# Patient Record
Sex: Male | Born: 1960 | Hispanic: No | Smoking: Former smoker
Health system: Southern US, Community
[De-identification: ages and names within clinical notes are randomized; demographics above are authoritative.]

## PROBLEM LIST (undated history)

## (undated) DIAGNOSIS — E119 Type 2 diabetes mellitus without complications: Secondary | ICD-10-CM

---

## 2010-07-09 ENCOUNTER — Ambulatory Visit (HOSPITAL_COMMUNITY)
Admission: RE | Admit: 2010-07-09 | Discharge: 2010-07-09 | Disposition: A | Discharge status: Home / Self Care | Payer: BC Managed Care – PPO | Source: Ambulatory Visit | Attending: Internal Medicine | Admitting: Internal Medicine

## 2010-07-09 ENCOUNTER — Encounter: Payer: Self-pay | Admitting: Internal Medicine

## 2010-07-09 DIAGNOSIS — Z7982 Long term (current) use of aspirin: Secondary | ICD-10-CM | POA: Insufficient documentation

## 2010-07-09 DIAGNOSIS — E785 Hyperlipidemia, unspecified: Secondary | ICD-10-CM | POA: Insufficient documentation

## 2010-07-09 DIAGNOSIS — Z1211 Encounter for screening for malignant neoplasm of colon: Secondary | ICD-10-CM | POA: Insufficient documentation

## 2010-07-09 DIAGNOSIS — K5730 Diverticulosis of large intestine without perforation or abscess without bleeding: Secondary | ICD-10-CM

## 2010-07-21 NOTE — Op Note (Addendum)
  NAME:  Nicholas Richards, Nicholas Richards               ACCOUNT NO.:  [id number]  MEDICAL RECORD NO.:  [id number]           PATIENT TYPE:  O  LOCATION:  DAYP                          FACILITY:  [facility]  PHYSICIAN:  R. Roetta Sessions, M.D. DATE OF BIRTH:  01/13/1961  DATE OF PROCEDURE:  07/09/2010 DATE OF DISCHARGE:                              OPERATIVE REPORT   INDICATIONS FOR PROCEDURE:  A 50 year old gentleman with no lower GI tract symptoms, sent over for his first ever colonoscopy at a courtesy of Dr. Oval Linsey.  There is no family history of polyps or colon cancer.  Colonoscopy is now being done as screening maneuver.  Risks, benefits, limitations, alternatives, imponderables have been reviewed, questions answered.  Please see the documentation in the medical record.  PROCEDURE NOTE:  O2 saturation, blood pressure, pulse, respirations were monitored the entirety of the procedure.  CONSCIOUS SEDATION:  Versed 4 mg IV, Demerol 75 mg IV in divided doses.  INSTRUMENT:  Pentax video chip system.  FINDINGS:  Digital rectal exam revealed no abnormalities.  Endoscopic findings:  Prep was good.  Colon:  Colonic mucosa was surveyed from the rectosigmoid junction through the left transverse right colon to the appendiceal orifice, ileocecal valve/cecum.  These structures were well seen and photographed for the record.  From this level, scope was slowly and cautiously withdrawn.  All previously mentioned mucosal surfaces were again seen.  The patient has scattered left-sided diverticula. Remainder of colonic mucosa appeared normal.  Scope was pulled down to the rectum where a thorough examination of rectal mucosa including retroflexed view of the anal verge was undertaken.  The rectal mucosa appeared normal.  The patient tolerated the procedure well and was reactive to endoscopy.  Cecal withdrawal time 7 minutes.  IMPRESSION: 1. Normal rectum. 2. Left-sided diverticula, remainder of colonic mucosa  appeared     normal.  RECOMMENDATIONS: 1. Diverticulosis literature provided to [name]. 2. Recommend repeat screening colonoscopy, 10 years.     Jonathon Bellows, M.D.     RMR/MEDQ  D:  07/09/2010  T:  07/09/2010  Job:  [id number]  cc:   Melvyn Novas, MD Fax: [phone]  Electronically Signed by Lorrin Goodell M.D. on 07/20/2010 02:41:33 PM Electronically Signed by Lorrin Goodell M.D. on 07/20/2010 03:17:17 PM Electronically Signed by Lorrin Goodell M.D. on 07/20/2010 03:42:01 PM Electronically Signed by Lorrin Goodell M.D. on 07/20/2010 04:18:43 PM Electronically Signed by Lorrin Goodell M.D. on 07/20/2010 04:49:53 PM Electronically Signed by Lorrin Goodell M.D. on 07/20/2010 04:49:53 PM Electronically Signed by Lorrin Goodell M.D. on 07/20/2010 07:36:57 PM

## 2012-04-30 ENCOUNTER — Telehealth: Payer: Self-pay | Admitting: Family Medicine

## 2012-04-30 NOTE — Telephone Encounter (Signed)
Not a pt here.

## 2013-08-17 ENCOUNTER — Emergency Department (HOSPITAL_COMMUNITY)
Admission: EM | Admit: 2013-08-17 | Discharge: 2013-08-17 | Disposition: A | Discharge status: Home / Self Care | Payer: BC Managed Care – PPO | Attending: Emergency Medicine | Admitting: Emergency Medicine

## 2013-08-17 ENCOUNTER — Encounter (HOSPITAL_COMMUNITY): Payer: Self-pay | Admitting: Emergency Medicine

## 2013-08-17 ENCOUNTER — Emergency Department (HOSPITAL_COMMUNITY): Payer: BC Managed Care – PPO

## 2013-08-17 DIAGNOSIS — J159 Unspecified bacterial pneumonia: Secondary | ICD-10-CM | POA: Insufficient documentation

## 2013-08-17 DIAGNOSIS — J029 Acute pharyngitis, unspecified: Secondary | ICD-10-CM | POA: Insufficient documentation

## 2013-08-17 DIAGNOSIS — J189 Pneumonia, unspecified organism: Secondary | ICD-10-CM

## 2013-08-17 DIAGNOSIS — Z792 Long term (current) use of antibiotics: Secondary | ICD-10-CM | POA: Insufficient documentation

## 2013-08-17 LAB — RAPID STREP SCREEN (MED CTR MEBANE ONLY): Streptococcus, Group A Screen (Direct): NEGATIVE

## 2013-08-17 MED ORDER — LEVOFLOXACIN 750 MG PO TABS: 750.0000 mg | ORAL_TABLET | Freq: Every day | ORAL | Status: DC

## 2013-08-17 MED ORDER — OXYCODONE-ACETAMINOPHEN 5-325 MG PO TABS: 1.0000 | ORAL_TABLET | Freq: Four times a day (QID) | ORAL | Status: DC | PRN

## 2013-08-17 NOTE — ED Notes (Signed)
Pt c/o cough, fever, and congestion x2 days. Denies n/v/d. Cough is productive with green sputum.

## 2013-08-17 NOTE — ED Notes (Signed)
Pt alert & oriented x4, stable gait. Patient given discharge instructions, paperwork & prescription(s). Patient  instructed to stop at the registration desk to finish any additional paperwork. Patient verbalized understanding. Pt left department w/ no further questions. 

## 2013-08-17 NOTE — Discharge Instructions (Signed)

## 2013-08-17 NOTE — ED Provider Notes (Signed)
CSN: [id number]     Arrival date & time 08/17/13  1016 History  This chart was scribed for No att. providers found,  by Ashley JacobsBrittany Andrews, ED Scribe. The patient was seen in room [facility] and the patient's care was started at 1:14 PM.   First MD Initiated Contact with Patient 08/17/13 1126     Chief Complaint  Patient presents with  . Cough  . Nasal Congestion     (Consider location/radiation/quality/duration/timing/severity/associated sxs/prior Treatment) HPI HPI Comments: Nicholas Richards is a 53 y.o. male who presents to the Emergency Department complaining of cough and nasal congestion onset two days ago. Pt has the associated symptoms of congestion,non productive cough, fever, and sore throat. He has tried Mucinex to no relief and antibiotics from a prior rx.  History reviewed. No pertinent past medical history. History reviewed. No pertinent past surgical history. History reviewed. No pertinent family history. History  Substance Use Topics  . Smoking status: Never Smoker   . Smokeless tobacco: Not on file  . Alcohol Use: No    Review of Systems  Constitutional: Positive for fever.  HENT: Positive for congestion and sore throat.   Respiratory: Positive for cough (non productive).   Gastrointestinal: Negative for nausea, vomiting and diarrhea.  Genitourinary: Negative for dysuria.  All other systems reviewed and are negative.      Allergies  Review of patient's allergies indicates no known allergies.  Home Medications   Current Outpatient Rx  Name  Route  Sig  Dispense  Refill  . amoxicillin (AMOXIL) 500 MG capsule   Oral   Take 500 mg by mouth 3 (three) times daily.         . GuaiFENesin (MUCINEX PO)   Oral   Take 10 mLs by mouth every 4 (four) hours as needed (for cough and congestion.).         Marland Kitchen. levofloxacin (LEVAQUIN) 750 MG tablet   Oral   Take 1 tablet (750 mg total) by mouth daily.   5 tablet   0   . oxyCODONE-acetaminophen (PERCOCET/ROXICET)  5-325 MG per tablet   Oral   Take 1-2 tablets by mouth every 6 (six) hours as needed for severe pain.   10 tablet   0    BP 114/79  Pulse 98  Temp(Src) 100.6 F (38.1 C) (Oral)  Resp 24  SpO2 97% Physical Exam  Constitutional: He is oriented to person, place, and time. He appears well-developed and well-nourished. He appears distressed.  HENT:  Head: Normocephalic.  Mouth/Throat: Posterior oropharyngeal erythema present. No oropharyngeal exudate or posterior oropharyngeal edema.  Nasal congestion Mild posterior erythema  Eyes: Pupils are equal, round, and reactive to light.  Neck: Normal range of motion. Erythema present.  Cardiovascular: Normal rate and normal heart sounds.  Exam reveals no gallop and no friction rub.   No murmur heard. Pulmonary/Chest: Effort normal. No respiratory distress. He has wheezes. He has rales.    Mild wheezes Scattered Rales     Musculoskeletal: Normal range of motion. He exhibits no edema.  No peripheral edema   Lymphadenopathy:    He has no cervical adenopathy.  Neurological: He is alert and oriented to person, place, and time. No cranial nerve deficit. Coordination normal.  Skin: Skin is warm and dry. No rash noted.  Psychiatric: He has a normal mood and affect. His behavior is normal.    ED Course  Procedures (including critical care time)     COORDINATION OF CARE:  1:14 PM Discussed course  of care with pt . Pt understands and agrees.    Labs Review Labs Reviewed  RAPID STREP SCREEN  CULTURE, GROUP A STREP   Imaging Review Dg Chest 2 View  08/17/2013   CLINICAL DATA:  cough  EXAM: CHEST  2 VIEW  COMPARISON:  None.  FINDINGS: Cardiac silhouette within normal limits. Focal area of increased density is appreciated within the right lower lobe. Osseous structures unremarkable.  IMPRESSION: Findings likely reflecting an infiltrate right lower lobe atelectasis cannot be excluded.   Electronically Signed   By: Salome Holmes M.D.    On: 08/17/2013 12:06     EKG Interpretation None      MDM   Final diagnoses:  CAP (community acquired pneumonia)    Patient presents with URI symptoms and cough. Negative strep, but x-ray shows pneumonia. Does have some scattered rales at that site. We'll treat as pneumonia. We'll give Levaquin since patient has been taking amoxicillin on his own.     Juliet Rude. Rubin Payor, MD 08/17/13 1314

## 2013-08-19 LAB — CULTURE, GROUP A STREP

## 2013-10-29 ENCOUNTER — Ambulatory Visit (HOSPITAL_COMMUNITY)
Admission: RE | Admit: 2013-10-29 | Discharge: 2013-10-29 | Disposition: A | Discharge status: Home / Self Care | Payer: BC Managed Care – PPO | Source: Ambulatory Visit | Attending: Family Medicine | Admitting: Family Medicine

## 2013-10-29 ENCOUNTER — Other Ambulatory Visit (HOSPITAL_COMMUNITY): Payer: Self-pay | Admitting: Family Medicine

## 2013-10-29 DIAGNOSIS — R05 Cough: Secondary | ICD-10-CM | POA: Insufficient documentation

## 2013-10-29 DIAGNOSIS — R059 Cough, unspecified: Secondary | ICD-10-CM

## 2017-12-31 ENCOUNTER — Encounter (HOSPITAL_COMMUNITY): Payer: Self-pay | Admitting: Emergency Medicine

## 2017-12-31 ENCOUNTER — Other Ambulatory Visit: Payer: Self-pay

## 2017-12-31 ENCOUNTER — Emergency Department (HOSPITAL_COMMUNITY)
Admission: EM | Admit: 2017-12-31 | Discharge: 2017-12-31 | Disposition: A | Discharge status: Home / Self Care | Payer: BLUE CROSS/BLUE SHIELD | Attending: Emergency Medicine | Admitting: Emergency Medicine

## 2017-12-31 ENCOUNTER — Emergency Department (HOSPITAL_COMMUNITY): Payer: BLUE CROSS/BLUE SHIELD

## 2017-12-31 DIAGNOSIS — J209 Acute bronchitis, unspecified: Secondary | ICD-10-CM | POA: Insufficient documentation

## 2017-12-31 DIAGNOSIS — R05 Cough: Secondary | ICD-10-CM | POA: Diagnosis present

## 2017-12-31 DIAGNOSIS — Z79899 Other long term (current) drug therapy: Secondary | ICD-10-CM | POA: Insufficient documentation

## 2017-12-31 DIAGNOSIS — Z87891 Personal history of nicotine dependence: Secondary | ICD-10-CM | POA: Insufficient documentation

## 2017-12-31 MED ORDER — LORATADINE-PSEUDOEPHEDRINE ER 5-120 MG PO TB12: 1.0000 | ORAL_TABLET | Freq: Two times a day (BID) | ORAL | 0 refills | Status: DC

## 2017-12-31 MED ORDER — PREDNISONE 20 MG PO TABS
40.0000 mg | ORAL_TABLET | Freq: Once | ORAL | Status: AC
  Administered 2017-12-31: 40 mg via ORAL
  Filled 2017-12-31: qty 2

## 2017-12-31 MED ORDER — AZITHROMYCIN 250 MG PO TABS: ORAL_TABLET | ORAL | 0 refills | Status: DC

## 2017-12-31 MED ORDER — ONDANSETRON HCL 4 MG PO TABS
4.0000 mg | ORAL_TABLET | Freq: Once | ORAL | Status: AC
  Administered 2017-12-31: 4 mg via ORAL
  Filled 2017-12-31: qty 1

## 2017-12-31 MED ORDER — DEXAMETHASONE 4 MG PO TABS: 4.0000 mg | ORAL_TABLET | Freq: Two times a day (BID) | ORAL | 0 refills | Status: DC

## 2017-12-31 MED ORDER — AZITHROMYCIN 250 MG PO TABS
500.0000 mg | ORAL_TABLET | Freq: Once | ORAL | Status: AC
  Administered 2017-12-31: 500 mg via ORAL
  Filled 2017-12-31: qty 2

## 2017-12-31 NOTE — Discharge Instructions (Addendum)
Your vital signs within normal limits.  Your oxygen level is 97% on room air.  Your chest x-ray is negative for pneumonia, collapsed lung, fluid, or other emergencies.  I suspect that you have a bronchitis and may be early upper respiratory infection.  Please use Zithromax 1 tablet daily with food, Claritin-D and Decadron 2 times daily with a meal.  Please increase fluids.  Please see Ms. Placey for additional evaluation and management if not improving.

## 2017-12-31 NOTE — ED Triage Notes (Signed)
Pt reports having "a tickle" in his throat x 3 weeks with coughing up clear thick mucous.  States cough is worse at night.  Denies other s/s.

## 2017-12-31 NOTE — ED Provider Notes (Signed)
[facility] Provider Note   CSN: [id number] Arrival date & time: 12/31/17  [id number]     History   Chief Complaint Chief Complaint  Patient presents with  . Cough    HPI Nicholas Richards is a 57 y.o. male.  The history is provided by the patient.  Cough  The current episode started more than 1 week ago. The problem has been gradually improving. The cough is non-productive. There has been no fever. Pertinent negatives include no chest pain, no chills, no ear pain, no sore throat, no shortness of breath and no wheezing. He has tried nothing for the symptoms. He is not a smoker. His past medical history is significant for bronchitis and pneumonia.    History reviewed. No pertinent past medical history.  There are no active problems to display for this patient.   History reviewed. No pertinent surgical history.      Home Medications    Prior to Admission medications   Medication Sig Start Date End Date Taking? Authorizing Provider  amoxicillin (AMOXIL) 500 MG capsule Take 500 mg by mouth 3 (three) times daily.    [provider]  azithromycin (ZITHROMAX) 250 MG tablet  12/27/17   [provider]  GuaiFENesin (MUCINEX PO) Take 10 mLs by mouth every 4 (four) hours as needed (for cough and congestion.).    [provider]  levofloxacin (LEVAQUIN) 750 MG tablet Take 1 tablet (750 mg total) by mouth daily. 08/17/13   Benjiman CorePickering, Nathan, MD  oxyCODONE-acetaminophen (PERCOCET/ROXICET) 5-325 MG per tablet Take 1-2 tablets by mouth every 6 (six) hours as needed for severe pain. 08/17/13   Benjiman CorePickering, Nathan, MD  rosuvastatin (CRESTOR) 20 MG tablet Take 1 tablet by mouth daily. 12/19/17   [provider]    Family History History reviewed. No pertinent family history.  Social History Social History   Tobacco Use  . Smoking status: Former Smoker  Substance Use Topics  . Alcohol use: No  . Drug use: No     Allergies   Patient has no  known allergies.   Review of Systems Review of Systems  Constitutional: Negative for activity change and chills.       All ROS Neg except as noted in HPI  HENT: Negative for ear pain, nosebleeds and sore throat.   Eyes: Negative for photophobia and discharge.  Respiratory: Positive for cough. Negative for shortness of breath and wheezing.   Cardiovascular: Negative for chest pain and palpitations.  Gastrointestinal: Negative for abdominal pain and blood in stool.  Genitourinary: Negative for dysuria, frequency and hematuria.  Musculoskeletal: Negative for arthralgias, back pain and neck pain.  Skin: Negative.   Neurological: Negative for dizziness, seizures and speech difficulty.  Psychiatric/Behavioral: Negative for confusion and hallucinations.     Physical Exam Updated Vital Signs BP 126/82 (BP Location: Right Arm)   Pulse 80   Temp (!) 97.3 F (36.3 C) (Oral)   Resp 16   Ht 5\' 11"  (1.803 m)   Wt 120.2 kg   SpO2 97%   BMI 36.96 kg/m   Physical Exam  Constitutional: He is oriented to person, place, and time. He appears well-developed and well-nourished.  Non-toxic appearance.  HENT:  Head: Normocephalic.  Right Ear: Tympanic membrane and external ear normal.  Left Ear: Tympanic membrane and external ear normal.  Eyes: Pupils are equal, round, and reactive to light. EOM and lids are normal.  Neck: Normal range of motion. Neck supple. Carotid bruit is not present.  Cardiovascular: Normal rate, regular rhythm, normal heart sounds, intact distal pulses and normal pulses.  Pulmonary/Chest: Breath sounds normal. No respiratory distress. He has no wheezes.  Abdominal: Soft. Bowel sounds are normal. There is no tenderness. There is no guarding.  Musculoskeletal: Normal range of motion.  Lymphadenopathy:       Head (right side): No submandibular adenopathy present.       Head (left side): No submandibular adenopathy present.    He has no cervical adenopathy.  Neurological:  He is alert and oriented to person, place, and time. He has normal strength. No cranial nerve deficit or sensory deficit.  Skin: Skin is warm and dry.  Psychiatric: He has a normal mood and affect. His speech is normal.  Nursing note and vitals reviewed.    ED Treatments / Results  Labs (all labs ordered are listed, but only abnormal results are displayed) Labs Reviewed - No data to display  EKG None  Radiology No results found.  Procedures Procedures (including critical care time)  Medications Ordered in ED Medications - No data to display   Initial Impression / Assessment and Plan / ED Course  I have reviewed the triage vital signs and the nursing notes.  Pertinent labs & imaging results that were available during my care of the patient were reviewed by me and considered in my medical decision making (see chart for details).      Final Clinical Impressions(s) / ED Diagnoses MDM  Vital signs within normal limits.  Pulse oximetry is 97% on room air.  Within normal limits by my interpretation.  Patient having cough sometimes productive.  No fever noted at home.  Patient speaks in complete sentences without problem.  The chest x-ray is negative for pneumonia or other emergent changes.  I suspect that the patient has an upper respiratory infection and possibly some bronchitis.  Patient will be treated with a short course of steroid.  He will use Claritin-D for congestion and also to help with the cough.  A prescription for Zithromax given to the patient.  Patient is to follow-up with Dr. Janna Archondiego for additional evaluation and management if not improving.  Patient is in agreement with this plan.   Final diagnoses:  Acute bronchitis, unspecified organism    ED Discharge Orders         Ordered    loratadine-pseudoephedrine (CLARITIN-D 12 HOUR) 5-120 MG tablet  2 times daily     12/31/17 1002    azithromycin (ZITHROMAX) 250 MG tablet     12/31/17 1002    dexamethasone  (DECADRON) 4 MG tablet  2 times daily with meals     12/31/17 1002           Ivery QualeBryant, Fredrica Capano, PA-C 12/31/17 1006    Samuel JesterMcManus, Kathleen, DO 12/31/17 1550

## 2020-02-03 IMAGING — DX DG CHEST 2V
2 series · 2 of 2 positions shown · non-contrast
Comparison: Chest x-ray dated October 29, 2013.

CLINICAL DATA: Productive cough for the past 3 weeks.

EXAM:
CHEST - 2 VIEW

[chest pa]
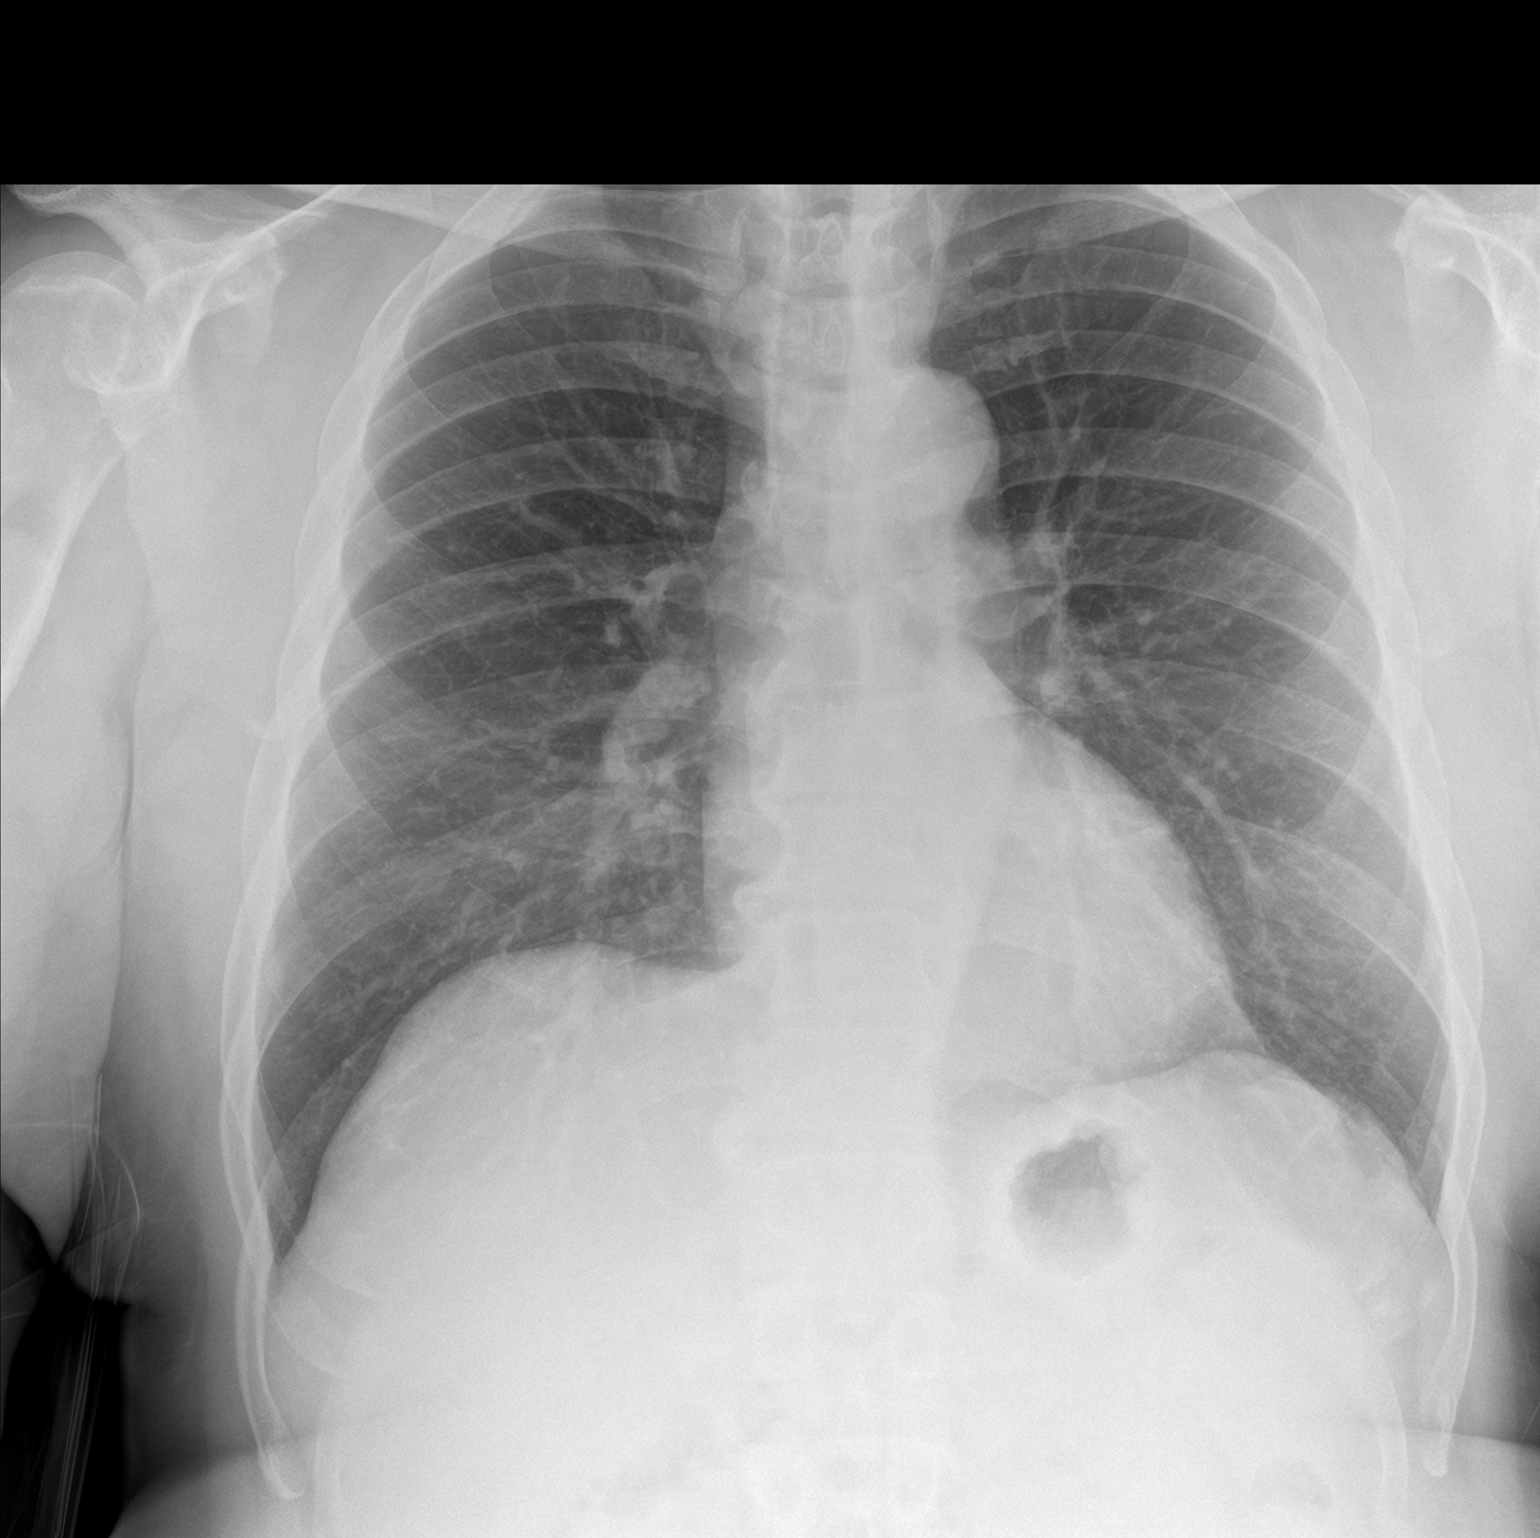

[chest lat]
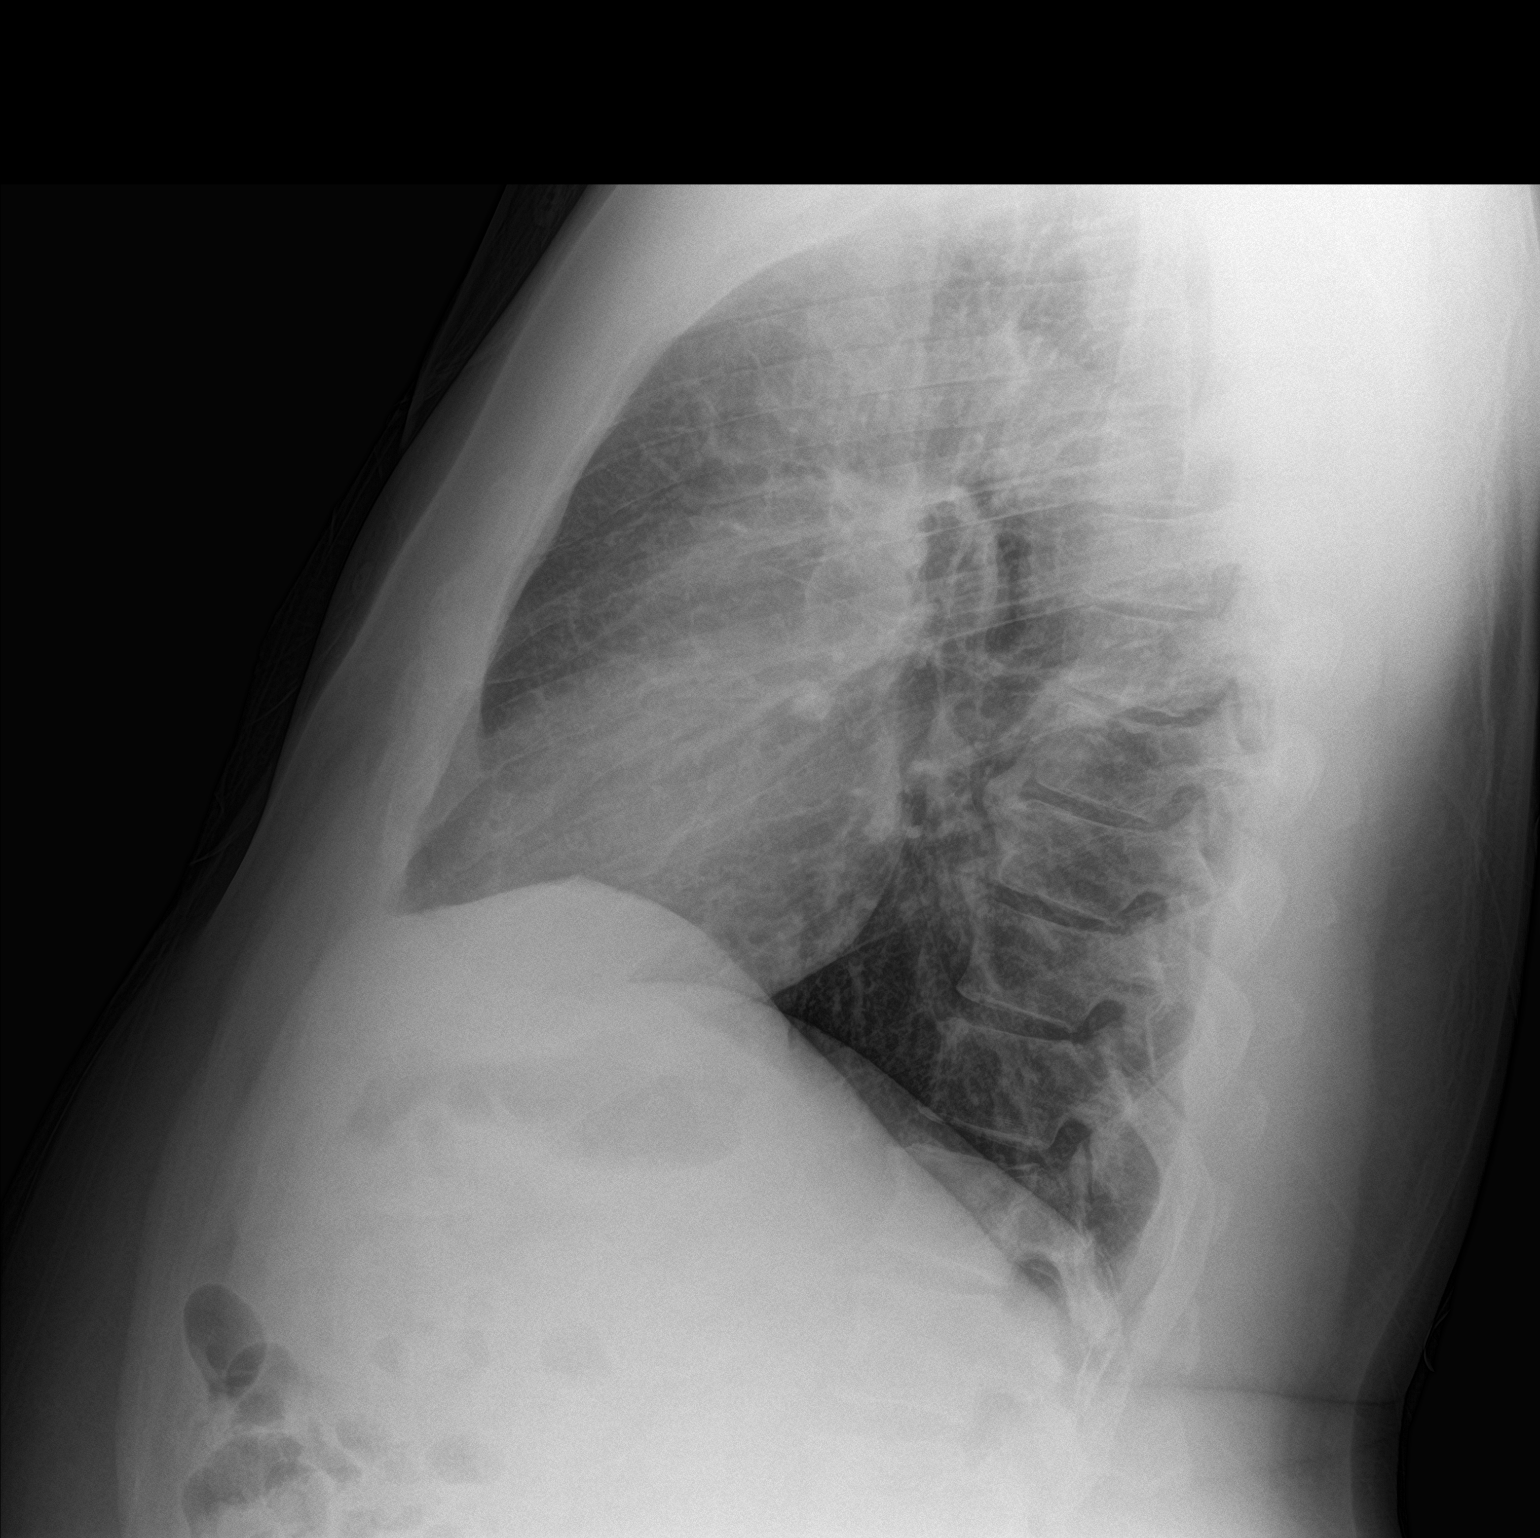

[2 of 2 positions shown; findings below may reference images not displayed]

FINDINGS: The heart size and mediastinal contours are within normal limits.
Both lungs are clear. The visualized skeletal structures are
unremarkable.
IMPRESSION: No active cardiopulmonary disease.

## 2020-06-17 ENCOUNTER — Encounter: Payer: Self-pay | Admitting: Internal Medicine

## 2020-08-03 ENCOUNTER — Other Ambulatory Visit: Payer: Self-pay

## 2020-08-03 ENCOUNTER — Ambulatory Visit (INDEPENDENT_AMBULATORY_CARE_PROVIDER_SITE_OTHER): Payer: Self-pay | Admitting: *Deleted

## 2020-08-03 VITALS — Ht 70.0 in | Wt 284.6 lb

## 2020-08-03 DIAGNOSIS — Z1211 Encounter for screening for malignant neoplasm of colon: Secondary | ICD-10-CM

## 2020-08-03 MED ORDER — PEG 3350-KCL-NA BICARB-NACL 420 G PO SOLR: 4000.0000 mL | Freq: Once | ORAL | 0 refills | Status: AC

## 2020-08-03 NOTE — Progress Notes (Signed)
Gastroenterology Pre-Procedure Review  Request Date: 08/03/2020 Requesting Physician: 10 year recall, Last TCS done 07/09/2010 by Dr. Jena Gauss, normal colon, left sided diverticula  PATIENT REVIEW QUESTIONS: The patient responded to the following health history questions as indicated:    1. Diabetes Melitis: yes, type II  2. Joint replacements in the past 12 months: no 3. Major health problems in the past 3 months: no 4. Has an artificial valve or MVP: no 5. Has a defibrillator: no 6. Has been advised in past to take antibiotics in advance of a procedure like teeth cleaning: no 7. Family history of colon cancer: no  8. Alcohol Use: no 9. Illicit drug Use: no 10. History of sleep apnea: no  11. History of coronary artery or other vascular stents placed within the last 12 months: no 12. History of any prior anesthesia complications: no 13. Body mass index is 40.84 kg/m.    MEDICATIONS & ALLERGIES:    Patient reports the following regarding taking any blood thinners:   Plavix? no Aspirin? no Coumadin? no Brilinta? no Xarelto? no Eliquis? no Pradaxa? no Savaysa? no Effient? no  Patient confirms/reports the following medications:  Current Outpatient Medications  Medication Sig Dispense Refill  . Cholecalciferol (VITAMIN D-3) 125 MCG (5000 UT) TABS Take by mouth daily.    . ertugliflozin L-PyroglutamicAc (STEGLATRO) 5 MG TABS tablet Take 5 mg by mouth daily.    . GuaiFENesin (MUCINEX PO) Take 10 mLs by mouth as needed (for cough and congestion.).    Marland Kitchen metFORMIN (GLUCOPHAGE) 500 MG tablet Take 500 mg by mouth 2 (two) times daily.    Marland Kitchen omeprazole (PRILOSEC) 20 MG capsule Take by mouth daily.    . rosuvastatin (CRESTOR) 20 MG tablet Take 1 tablet by mouth daily.  5  . sildenafil (VIAGRA) 100 MG tablet Take 100 mg by mouth daily as needed.     No current facility-administered medications for this visit.    Patient confirms/reports the following allergies:  No Known Allergies  No  orders of the defined types were placed in this encounter.   AUTHORIZATION INFORMATION Primary Insurance: [facility],  ID #: [id number],  Group #: [id number] Pre-Cert / Berkley Harvey required:  Pre-Cert / Auth #:   SCHEDULE INFORMATION: Procedure has been scheduled as follows:  Date: 09/16/2020, Time: PM procedure Location: [facility] with Dr. Jena Gauss  This Gastroenterology Pre-Precedure Review Form is being routed to the following provider(s): Tana Coast, PA

## 2020-08-03 NOTE — Patient Instructions (Addendum)
Covid test: 09/14/2020 at 3:45 PM.   Nicholas Richards   1960-05-31 MRN: [id number] Procedure Date: 09/16/2020 Arrival Time:   You will receive a call from the hospital a few days before your procedure.     [facility] of [facility]  PREPARATION FOR COLONOSCOPY WITH TRI-LYTE PREP   Please notify us immediately if you are diabetic, take iron supplements, or if you are on Coumadin or any other blood thinners.   Please hold the following medications: See letter  You will need to purchase 1 fleet enema and 1 box of Bisacodyl 5mg  tablets.   PROCEDURE IS SCHEDULED FOR AS FOLLOWS:  Procedure Date: 09/16/2020 Time to register: You will receive a call from the hospital a few days before your procedure. Place to register: 11/16/2020 Short Stay Scheduled provider: Dr. [facility]   2 DAYS BEFORE PROCEDURE:  DATE: 09/14/2020   DAY: Monday Begin clear liquid diet AFTER your lunch meal. NO SOLID FOODS!   1 DAY BEFORE PROCEDURE:  DATE: 09/15/2020   DAY: Tuesday  Continue clear liquids the entire day - NO SOLID FOOD.   Diabetic medications adjustments for today: See letter  At 12:00pm (noon): Take 2 (two) Dulcolax (Bisacodyl) tablets  At 2:00pm: Start drinking your solution. Try to drink 1 (one) 8 ounce glass every 10-15 minutes, until you have consumed HALF the jug. (You should complete the first 1/2 of the jug in 2 hours. Wait 30 minutes, then drink 3-4 more glasses of the solution. Your stools should be clear; if not, you may have to consume the rest of the jug.   One hour after completing the solution: take the last 2 (two) Dulcolax (Bisacodyl) tablets, with a clear liquid.  YOU MUST DRINK PLENTY OF CLEAR LIQUIDS DURING YOUR PREP TO REDUCE RISKS OF KIDNEY FAILURE.   Continue clear liquids only, until 4 hours before your scheduled procedure time. Do not eat or drink anything 4 hours before your procedure starts.  EXCEPTION:  If you take medications for your heart,  blood pressure or breathing, you may take these medications with a small amount of clear liquid.      DAY OF PROCEDURE:   DATE: 09/16/2020      DAY: Wednesday The morning of your procedure give yourself 1 (one) Fleet Enema, at least 1 hour before going to the hospital.   You may take Tylenol products. Please continue your regular medications unless we have instructed otherwise.   Diabetic medications adjustments for today. See letter  Someone MUST be available to drive you home; the hospital will cancel this appointment if you do not have a driver.   Please call the office if you have any questions (Dept: [phone]).  Please see below for Dietary Information.  CLEAR LIQUIDS INCLUDE:  Water Jello (NOT red in color)   Ice Popsicles (NOT red in color)   Tea (sugar ok, no milk/cream) Powdered fruit flavored drinks  Coffee (sugar ok, no milk/cream) Gatorade/ Lemonade/ Kool-Aid  (NOT red in color)   Juice: apple, white grape, white cranberry Soft drinks  Clear bullion, consomme, broth (fat free beef/chicken/vegetable)  Carbonated beverages (any kind)  Strained chicken noodle soup Hard Candy   REMEMBER: Clear liquids are liquids that will allow you to see your fingers on the other side of a clear glass. Be sure liquids are NOT red in color, and not cloudy, but CLEAR.   DO NOT EAT OR DRINK ANY OF THE FOLLOWING:  Dairy products of any  kind   Cranberry juice Tomato juice / V8 juice   Grapefruit juice Orange juice     Red grape juice  Do not eat any solid foods, including such foods as: cereal, oatmeal, yogurt, fruits, vegetables, creamed soups, eggs, bread, etc.    HELPFUL HINTS FOR DRINKING PREP SOLUTION:   Make sure prep is extremely cold. Refrigerate the night before. You may also put in the freezer.   You may try mixing some Crystal Light or Country Time Lemonade if you prefer. Mix in small amounts; add more if necessary.  Try drinking through a straw  Rinse mouth with  water or a mouthwash between glasses, to remove after-taste.  Try sipping on a cold beverage /ice/ popsicles between glasses of prep  Place a piece of sugar-free hard candy in mouth between glasses  If you become nauseated, try consuming smaller amounts, or stretch out the time between glasses. Stop for 30-60 minutes, then slowly start back drinking    You may call the office (Dept: [phone]) before 5:00pm, or page the doctor on call after 5:00pm ([phone]), for further instructions, if necessary.   OTHER INSTRUCTIONS  You will need a responsible adult at least 61 years of age to accompany you and drive you home. This person must remain in the waiting room during your procedure.  Wear loose fitting clothing that is easily removed.  Leave jewelry and other valuables at home.   Remove all body piercing jewelry and leave at home.  Total time from sign-in until discharge is approximately 2-3 hours.  You should go home directly after your procedure and rest. You can resume normal activities the day after your procedure.  The day of your procedure you should not:  Drive  Make legal decisions  Operate machinery  Drink alcohol  Return to work

## 2020-08-04 NOTE — Progress Notes (Signed)
OK to schedule conscious sedation with Dr. Jena Gauss. ASA III.   Day of prep: steglatro 1/2 tab daily, metformin 1/2 tab BID AM of TCS: Hold steglatro, metformin

## 2020-08-05 ENCOUNTER — Encounter: Payer: Self-pay | Admitting: *Deleted

## 2020-08-05 NOTE — Progress Notes (Signed)
Mailed letter to pt with diabetes medication adjustments.   

## 2020-08-12 ENCOUNTER — Encounter: Payer: Self-pay | Admitting: *Deleted

## 2020-09-14 ENCOUNTER — Other Ambulatory Visit: Payer: Self-pay

## 2020-09-14 ENCOUNTER — Other Ambulatory Visit (HOSPITAL_COMMUNITY)
Admission: RE | Admit: 2020-09-14 | Discharge: 2020-09-14 | Disposition: A | Discharge status: Home / Self Care | Payer: 59 | Source: Ambulatory Visit | Attending: Internal Medicine | Admitting: Internal Medicine

## 2020-09-14 DIAGNOSIS — Z20822 Contact with and (suspected) exposure to covid-19: Secondary | ICD-10-CM | POA: Insufficient documentation

## 2020-09-14 DIAGNOSIS — Z01812 Encounter for preprocedural laboratory examination: Secondary | ICD-10-CM | POA: Diagnosis present

## 2020-09-15 LAB — SARS CORONAVIRUS 2 (TAT 6-24 HRS): SARS Coronavirus 2: NEGATIVE

## 2020-09-16 ENCOUNTER — Ambulatory Visit (HOSPITAL_COMMUNITY)
Admission: RE | Admit: 2020-09-16 | Discharge: 2020-09-16 | Disposition: A | Discharge status: Home / Self Care | Payer: 59 | Source: Ambulatory Visit | Attending: Internal Medicine | Admitting: Internal Medicine

## 2020-09-16 ENCOUNTER — Other Ambulatory Visit: Payer: Self-pay

## 2020-09-16 ENCOUNTER — Encounter (HOSPITAL_COMMUNITY)
Admission: RE | Disposition: A | Discharge status: Home / Self Care | Payer: Self-pay | Source: Ambulatory Visit | Attending: Internal Medicine

## 2020-09-16 ENCOUNTER — Encounter (HOSPITAL_COMMUNITY): Payer: Self-pay | Admitting: Internal Medicine

## 2020-09-16 DIAGNOSIS — Z87891 Personal history of nicotine dependence: Secondary | ICD-10-CM | POA: Insufficient documentation

## 2020-09-16 DIAGNOSIS — Z1211 Encounter for screening for malignant neoplasm of colon: Secondary | ICD-10-CM | POA: Diagnosis present

## 2020-09-16 DIAGNOSIS — Z7984 Long term (current) use of oral hypoglycemic drugs: Secondary | ICD-10-CM | POA: Diagnosis not present

## 2020-09-16 DIAGNOSIS — K5730 Diverticulosis of large intestine without perforation or abscess without bleeding: Secondary | ICD-10-CM | POA: Insufficient documentation

## 2020-09-16 DIAGNOSIS — E119 Type 2 diabetes mellitus without complications: Secondary | ICD-10-CM | POA: Diagnosis not present

## 2020-09-16 DIAGNOSIS — Z79899 Other long term (current) drug therapy: Secondary | ICD-10-CM | POA: Diagnosis not present

## 2020-09-16 HISTORY — PX: COLONOSCOPY: SHX5424

## 2020-09-16 HISTORY — DX: Type 2 diabetes mellitus without complications: E11.9

## 2020-09-16 LAB — GLUCOSE, CAPILLARY: Glucose-Capillary: 81 mg/dL (ref 70–99)

## 2020-09-16 SURGERY — COLONOSCOPY
Anesthesia: Moderate Sedation

## 2020-09-16 MED ORDER — ONDANSETRON HCL 4 MG/2ML IJ SOLN
INTRAMUSCULAR | Status: DC | PRN
  Administered 2020-09-16: 4 mg via INTRAVENOUS

## 2020-09-16 MED ORDER — MEPERIDINE HCL 50 MG/ML IJ SOLN
INTRAMUSCULAR | Status: AC
  Filled 2020-09-16: qty 1

## 2020-09-16 MED ORDER — MIDAZOLAM HCL 5 MG/5ML IJ SOLN
INTRAMUSCULAR | Status: AC
  Filled 2020-09-16: qty 10

## 2020-09-16 MED ORDER — SODIUM CHLORIDE 0.9 % IV SOLN: INTRAVENOUS | Status: DC

## 2020-09-16 MED ORDER — MEPERIDINE HCL 100 MG/ML IJ SOLN
INTRAMUSCULAR | Status: DC | PRN
  Administered 2020-09-16: 25 mg via INTRAVENOUS

## 2020-09-16 MED ORDER — STERILE WATER FOR IRRIGATION IR SOLN
Status: DC | PRN
  Administered 2020-09-16: 200 mL

## 2020-09-16 MED ORDER — ONDANSETRON HCL 4 MG/2ML IJ SOLN
INTRAMUSCULAR | Status: AC
  Filled 2020-09-16: qty 2

## 2020-09-16 MED ORDER — MIDAZOLAM HCL 5 MG/5ML IJ SOLN
INTRAMUSCULAR | Status: DC | PRN
  Administered 2020-09-16: 2 mg via INTRAVENOUS
  Administered 2020-09-16: 1 mg via INTRAVENOUS

## 2020-09-16 NOTE — H&P (Signed)
@  XNAT@   Primary Care Physician:  Kirstie Peri, MD Primary Gastroenterologist:  Dr. Jena Gauss  Pre-Procedure History & Physical: HPI:  Nicholas Richards is a 60 y.o. male here for average risk screening colonoscopy.  No bowel symptoms.  Negative colonoscopy 10 years ago.  No family history of colon cancer.  Past Medical History:  Diagnosis Date  . Diabetes mellitus without complication (HCC)     History reviewed. No pertinent surgical history.  Prior to Admission medications   Medication Sig Start Date End Date Taking? Authorizing Provider  Cholecalciferol (VITAMIN D-3) 125 MCG (5000 UT) TABS Take 5,000 Units by mouth daily.   Yes [provider]  ertugliflozin L-PyroglutamicAc (STEGLATRO) 5 MG TABS tablet Take 5 mg by mouth daily.   Yes [provider]  metFORMIN (GLUCOPHAGE) 500 MG tablet Take 500 mg by mouth 2 (two) times daily. 07/31/20  Yes [provider]  omeprazole (PRILOSEC) 20 MG capsule Take 20 mg by mouth daily. 07/08/20  Yes [provider]  rosuvastatin (CRESTOR) 20 MG tablet Take 20 mg by mouth daily. 12/19/17  Yes [provider]  sildenafil (VIAGRA) 100 MG tablet Take 100 mg by mouth daily as needed for erectile dysfunction. 05/18/20  Yes [provider]    Allergies as of 08/05/2020  . (No Known Allergies)    History reviewed. No pertinent family history.  Social History   Socioeconomic History  . Marital status: Unknown    Spouse name: Not on file  . Number of children: Not on file  . Years of education: Not on file  . Highest education level: Not on file  Occupational History  . Not on file  Tobacco Use  . Smoking status: Former Games developer  . Smokeless tobacco: Never Used  Substance and Sexual Activity  . Alcohol use: No  . Drug use: No  . Sexual activity: Yes  Other Topics Concern  . Not on file  Social History Narrative  . Not on file   Social Determinants of Health   Financial Resource Strain: Not on  file  Food Insecurity: Not on file  Transportation Needs: Not on file  Physical Activity: Not on file  Stress: Not on file  Social Connections: Not on file  Intimate Partner Violence: Not on file    Review of Systems: See HPI, otherwise negative ROS  Physical Exam: There were no vitals taken for this visit. General:   Alert,  Well-developed, well-nourished, pleasant and cooperative in NAD Nose:  No deformity, discharge,  or lesions. Mouth:  No deformity or lesions. Neck:  Supple; no masses or thyromegaly. No significant cervical adenopathy. Lungs:  Clear throughout to auscultation.   No wheezes, crackles, or rhonchi. No acute distress. Heart:  Regular rate and rhythm; no murmurs, clicks, rubs,  or gallops. Abdomen: Non-distended, normal bowel sounds.  Soft and nontender without appreciable mass or hepatosplenomegaly.  Impression/Plan: 60 year old gentleman here for average risk screening colonoscopy.  Negative exam 10 years ago.  No bowel symptoms.  I have offered the patient an average risk screening colonoscopy today.The risks, benefits, limitations, alternatives and imponderables have been reviewed with the patient. Questions have been answered. All parties are agreeable.      Notice: This dictation was prepared with Dragon dictation along with smaller phrase technology. Any transcriptional errors that result from this process are unintentional and may not be corrected upon review.

## 2020-09-16 NOTE — Discharge Instructions (Signed)
Colonoscopy Discharge Instructions  Read the instructions outlined below and refer to this sheet in the next few weeks. These discharge instructions provide you with general information on caring for yourself after you leave the hospital. Your doctor may also give you specific instructions. While your treatment has been planned according to the most current medical practices available, unavoidable complications occasionally occur. If you have any problems or questions after discharge, call Dr. Jena Gauss at [phone]. ACTIVITY  You may resume your regular activity, but move at a slower pace for the next 24 hours.   Take frequent rest periods for the next 24 hours.   Walking will help get rid of the air and reduce the bloated feeling in your belly (abdomen).   No driving for 24 hours (because of the medicine (anesthesia) used during the test).    Do not sign any important legal documents or operate any machinery for 24 hours (because of the anesthesia used during the test).  NUTRITION  Drink plenty of fluids.   You may resume your normal diet as instructed by your doctor.   Begin with a light meal and progress to your normal diet. Heavy or fried foods are harder to digest and may make you feel sick to your stomach (nauseated).   Avoid alcoholic beverages for 24 hours or as instructed.  MEDICATIONS  You may resume your normal medications unless your doctor tells you otherwise.  WHAT YOU CAN EXPECT TODAY  Some feelings of bloating in the abdomen.   Passage of more gas than usual.   Spotting of blood in your stool or on the toilet paper.  IF YOU HAD POLYPS REMOVED DURING THE COLONOSCOPY:  No aspirin products for 7 days or as instructed.   No alcohol for 7 days or as instructed.   Eat a soft diet for the next 24 hours.  FINDING OUT THE RESULTS OF YOUR TEST Not all test results are available during your visit. If your test results are not back during the visit, make an appointment  with your caregiver to find out the results. Do not assume everything is normal if you have not heard from your caregiver or the medical facility. It is important for you to follow up on all of your test results.  SEEK IMMEDIATE MEDICAL ATTENTION IF:  You have more than a spotting of blood in your stool.   Your belly is swollen (abdominal distention).   You are nauseated or vomiting.   You have a temperature over 101.   You have abdominal pain or discomfort that is severe or gets worse throughout the day.    Diverticulosis only found today.  No polyps.  1 more screening colonoscopy in 10 years recommended  At patient request I called Dr. Lisabeth Register at [phone] and reviewed results   Diverticulosis  Diverticulosis is a condition that develops when small pouches (diverticula) form in the wall of the large intestine (colon). The colon is where water is absorbed and stool (feces) is formed. The pouches form when the inside layer of the colon pushes through weak spots in the outer layers of the colon. You may have a few pouches or many of them. The pouches usually do not cause problems unless they become inflamed or infected. When this happens, the condition is called diverticulitis. What are the causes? The cause of this condition is not known. What increases the risk? The following factors may make you more likely to develop this condition:  Being older than age 84.  Your risk for this condition increases with age. Diverticulosis is rare among people younger than age 4. By age 76, many people have it.  Eating a low-fiber diet.  Having frequent constipation.  Being overweight.  Not getting enough exercise.  Smoking.  Taking over-the-counter pain medicines, like aspirin and ibuprofen.  Having a family history of diverticulosis. What are the signs or symptoms? In most people, there are no symptoms of this condition. If you do have symptoms, they may  include:  Bloating.  Cramps in the abdomen.  Constipation or diarrhea.  Pain in the lower left side of the abdomen. How is this diagnosed? Because diverticulosis usually has no symptoms, it is most often diagnosed during an exam for other colon problems. The condition may be diagnosed by:  Using a flexible scope to examine the colon (colonoscopy).  Taking an X-ray of the colon after dye has been put into the colon (barium enema).  Having a CT scan. How is this treated? You may not need treatment for this condition. Your health care provider may recommend treatment to prevent problems. You may need treatment if you have symptoms or if you previously had diverticulitis. Treatment may include:  Eating a high-fiber diet.  Taking a fiber supplement.  Taking a live bacteria supplement (probiotic).  Taking medicine to relax your colon.   Follow these instructions at home: Medicines  Take over-the-counter and prescription medicines only as told by your health care provider.  If told by your health care provider, take a fiber supplement or probiotic. Constipation prevention Your condition may cause constipation. To prevent or treat constipation, you may need to:  Drink enough fluid to keep your urine pale yellow.  Take over-the-counter or prescription medicines.  Eat foods that are high in fiber, such as beans, whole grains, and fresh fruits and vegetables.  Limit foods that are high in fat and processed sugars, such as fried or sweet foods.   General instructions  Try not to strain when you have a bowel movement.  Keep all follow-up visits as told by your health care provider. This is important. Contact a health care provider if you:  Have pain in your abdomen.  Have bloating.  Have cramps.  Have not had a bowel movement in 3 days. Get help right away if:  Your pain gets worse.  Your bloating becomes very bad.  You have a fever or chills, and your symptoms  suddenly get worse.  You vomit.  You have bowel movements that are bloody or black.  You have bleeding from your rectum. Summary  Diverticulosis is a condition that develops when small pouches (diverticula) form in the wall of the large intestine (colon).  You may have a few pouches or many of them.  This condition is most often diagnosed during an exam for other colon problems.  Treatment may include increasing the fiber in your diet, taking supplements, or taking medicines. This information is not intended to replace advice given to you by your health care provider. Make sure you discuss any questions you have with your health care provider. Document Revised: 11/29/2018 Document Reviewed: 11/29/2018 Elsevier Patient Education  [employer].

## 2020-09-16 NOTE — Op Note (Signed)
[facility] Patient Name: Nicholas Richards Procedure Date: 09/16/2020 10:35 AM MRN: [id number] Date of Birth: 09/12/1960 Attending MD: Gennette Pacobert Michael [name] , MD CSN: [id number] Age: [age] Admit Type: Outpatient Procedure:                Colonoscopy Indications:              Screening for colorectal malignant neoplasm Providers:                Gennette Pacobert Michael Ameya Vowell, MD, Nena PolioLisa Moore, RN, Durwin GlazeJay                            Christopher Tech, Technician Referring MD:              Medicines:                Midazolam 3 mg IV, Meperidine 25 mg IV Complications:            No immediate complications. Estimated Blood Loss:     Estimated blood loss: none. Procedure:                Pre-Anesthesia Assessment:                           - Prior to the procedure, a History and Physical                            was performed, and patient medications and                            allergies were reviewed. The patient's tolerance of                            previous anesthesia was also reviewed. The risks                            and benefits of the procedure and the sedation                            options and risks were discussed with the patient.                            All questions were answered, and informed consent                            was obtained. Prior Anticoagulants: The patient has                            taken no previous anticoagulant or antiplatelet                            agents. ASA Grade Assessment: II - A patient with                            mild systemic disease. After reviewing the risks  and benefits, the patient was deemed in                            satisfactory condition to undergo the procedure.                           After obtaining informed consent, the colonoscope                            was passed under direct vision. Throughout the                            procedure, the patient's blood pressure, pulse, and                             oxygen saturations were monitored continuously. The                            CF-[id number] ([id number]) scope was introduced through                            the anus and advanced to the the cecum, identified                            by appendiceal orifice and ileocecal valve. The                            colonoscopy was performed without difficulty. The                            patient tolerated the procedure well. The quality                            of the bowel preparation was adequate. Scope In: 11:00:36 AM Scope Out: 11:10:34 AM Scope Withdrawal Time: 0 hours 6 minutes 28 seconds  Total Procedure Duration: 0 hours 9 minutes 58 seconds  Findings:      The perianal and digital rectal examinations were normal.      Scattered small-mouthed diverticula were found in the sigmoid colon and       descending colon.      The exam was otherwise without abnormality on direct and retroflexion       views. Impression:               - Diverticulosis in the sigmoid colon and in the                            descending colon.                           - The examination was otherwise normal on direct                            and retroflexion views.                           -  No specimens collected. Moderate Sedation:      Moderate (conscious) sedation was administered by the endoscopy nurse       and supervised by the endoscopist. The following parameters were       monitored: oxygen saturation, heart rate, blood pressure, respiratory       rate, EKG, adequacy of pulmonary ventilation, and response to care.       Total physician intraservice time was 17 minutes. Recommendation:           - Patient has a contact number available for                            emergencies. The signs and symptoms of potential                            delayed complications were discussed with the                            patient. Return to normal activities tomorrow.                             Written discharge instructions were provided to the                            patient.                           - Resume previous diet.                           - Continue present medications.                           - Repeat colonoscopy (date not yet determined) for                            screening purposes.                           - Return to GI office (date not yet determined). Procedure Code(s):        --- Professional ---                           [phone], Colonoscopy, flexible; diagnostic, including                            collection of specimen(s) by brushing or washing,                            when performed (separate procedure)                           G0500, Moderate sedation services provided by the                            same physician or other qualified health care  professional performing a gastrointestinal                            endoscopic service that sedation supports,                            requiring the presence of an independent trained                            observer to assist in the monitoring of the                            patient's level of consciousness and physiological                            status; initial 15 minutes of intra-service time;                            patient age 70 years or older (additional time may                            be reported with 16109, as appropriate) Diagnosis Code(s):        --- Professional ---                           Z12.11, Encounter for screening for malignant                            neoplasm of colon                           K57.30, Diverticulosis of large intestine without                            perforation or abscess without bleeding CPT copyright 2019 American Medical Association. All rights reserved. The codes documented in this report are preliminary and upon coder review may  be revised to meet current compliance requirements. Gerrit Friends.  Ayo Guarino, MD Gennette Pac, MD 09/16/2020 11:25:36 AM This report has been signed electronically. Number of Addenda: 0

## 2020-09-23 ENCOUNTER — Encounter (HOSPITAL_COMMUNITY): Payer: Self-pay | Admitting: Internal Medicine

## 2022-03-11 DIAGNOSIS — J329 Chronic sinusitis, unspecified: Secondary | ICD-10-CM | POA: Diagnosis not present

## 2022-03-11 DIAGNOSIS — R062 Wheezing: Secondary | ICD-10-CM | POA: Diagnosis not present

## 2022-10-06 ENCOUNTER — Other Ambulatory Visit: Payer: Self-pay

## 2022-10-06 ENCOUNTER — Emergency Department (HOSPITAL_COMMUNITY)
Admission: EM | Admit: 2022-10-06 | Discharge: 2022-10-06 | Disposition: A | Discharge status: Home / Self Care | Payer: PRIVATE HEALTH INSURANCE | Attending: Emergency Medicine | Admitting: Emergency Medicine

## 2022-10-06 ENCOUNTER — Emergency Department (HOSPITAL_COMMUNITY): Payer: PRIVATE HEALTH INSURANCE

## 2022-10-06 DIAGNOSIS — Z79899 Other long term (current) drug therapy: Secondary | ICD-10-CM | POA: Diagnosis not present

## 2022-10-06 DIAGNOSIS — M25552 Pain in left hip: Secondary | ICD-10-CM | POA: Diagnosis not present

## 2022-10-06 DIAGNOSIS — I10 Essential (primary) hypertension: Secondary | ICD-10-CM | POA: Diagnosis not present

## 2022-10-06 DIAGNOSIS — Z7984 Long term (current) use of oral hypoglycemic drugs: Secondary | ICD-10-CM | POA: Insufficient documentation

## 2022-10-06 DIAGNOSIS — M545 Low back pain, unspecified: Secondary | ICD-10-CM | POA: Diagnosis present

## 2022-10-06 DIAGNOSIS — M5432 Sciatica, left side: Secondary | ICD-10-CM | POA: Insufficient documentation

## 2022-10-06 MED ORDER — OXYCODONE-ACETAMINOPHEN 5-325 MG PO TABS: ORAL_TABLET | ORAL | 0 refills | Status: AC

## 2022-10-06 MED ORDER — OXYCODONE-ACETAMINOPHEN 5-325 MG PO TABS: ORAL_TABLET | ORAL | 0 refills | Status: DC

## 2022-10-06 MED ORDER — HYDROMORPHONE HCL 1 MG/ML IJ SOLN
1.0000 mg | Freq: Once | INTRAMUSCULAR | Status: AC
  Administered 2022-10-06: 1 mg via INTRAMUSCULAR
  Filled 2022-10-06: qty 1

## 2022-10-06 NOTE — Discharge Instructions (Signed)
Follow-up with your family doctor as scheduled.  Return sooner if problems

## 2022-10-06 NOTE — ED Triage Notes (Addendum)
Pt c/o sciatic pain to left hip that radiates down leg. Was seen at PCP on Monday for same and was given prednisone. Pt states the pain has gotten worse since.

## 2022-10-06 NOTE — ED Provider Notes (Signed)
[place] EMERGENCY DEPARTMENT AT [facility] Provider Note   CSN: [id number] Arrival date & time: 10/06/22  4098     History  No chief complaint on file.   Nicholas Richards is a 62 y.o. male.  Patient complains of pain in his lower back and down his left hip.  He saw his primary care doctor who put him on prednisone and anti-inflammatory.  Patient continues to have pain.  He has history of hypertension.     The history is provided by the patient and medical records. No language interpreter was used.  Hip Pain This is a new problem. The current episode started less than 1 hour ago. The problem occurs constantly. The problem has not changed since onset.Pertinent negatives include no chest pain, no abdominal pain and no headaches. Nothing aggravates the symptoms. He has tried nothing for the symptoms.       Home Medications Prior to Admission medications   Medication Sig Start Date End Date Taking? Authorizing Provider  Cholecalciferol (VITAMIN D-3) 125 MCG (5000 UT) TABS Take 5,000 Units by mouth daily.    [provider]  ertugliflozin L-PyroglutamicAc (STEGLATRO) 5 MG TABS tablet Take 5 mg by mouth daily.    [provider]  metFORMIN (GLUCOPHAGE) 500 MG tablet Take 500 mg by mouth 2 (two) times daily. 07/31/20   [provider]  omeprazole (PRILOSEC) 20 MG capsule Take 20 mg by mouth daily. 07/08/20   [provider]  rosuvastatin (CRESTOR) 20 MG tablet Take 20 mg by mouth daily. 12/19/17   [provider]  sildenafil (VIAGRA) 100 MG tablet Take 100 mg by mouth daily as needed for erectile dysfunction. 05/18/20   [provider]      Allergies    Patient has no known allergies.    Review of Systems   Review of Systems  Constitutional:  Negative for appetite change and fatigue.  HENT:  Negative for congestion, ear discharge and sinus pressure.   Eyes:  Negative for discharge.  Respiratory:  Negative for cough.    Cardiovascular:  Negative for chest pain.  Gastrointestinal:  Negative for abdominal pain and diarrhea.  Genitourinary:  Negative for frequency and hematuria.  Musculoskeletal:  Negative for back pain.       Leg pain  Skin:  Negative for rash.  Neurological:  Negative for seizures and headaches.  Psychiatric/Behavioral:  Negative for hallucinations.     Physical Exam Updated Vital Signs BP (!) 150/85 (BP Location: Right Arm)   Pulse 83   Temp 98.9 F (37.2 C) (Oral)   Resp 20   Ht 5\' 10"  (1.778 m)   Wt 127 kg   SpO2 99%   BMI 40.17 kg/m  Physical Exam Vitals and nursing note reviewed.  Constitutional:      Appearance: He is well-developed.  HENT:     Head: Normocephalic.     Nose: Nose normal.  Eyes:     General: No scleral icterus.    Conjunctiva/sclera: Conjunctivae normal.  Neck:     Thyroid: No thyromegaly.  Cardiovascular:     Rate and Rhythm: Normal rate and regular rhythm.     Heart sounds: No murmur heard.    No friction rub. No gallop.  Pulmonary:     Breath sounds: No stridor. No wheezing or rales.  Chest:     Chest wall: No tenderness.  Abdominal:     General: There is no distension.     Tenderness: There is no abdominal  tenderness. There is no rebound.  Musculoskeletal:        General: Normal range of motion.     Cervical back: Neck supple.  Lymphadenopathy:     Cervical: No cervical adenopathy.  Skin:    Findings: No erythema or rash.  Neurological:     Mental Status: He is alert and oriented to person, place, and time.     Motor: No abnormal muscle tone.     Coordination: Coordination normal.     Comments: Positive straight leg raise on the left  Psychiatric:        Behavior: Behavior normal.     ED Results / Procedures / Treatments   Labs (all labs ordered are listed, but only abnormal results are displayed) Labs Reviewed - No data to display  EKG None  Radiology DG Lumbar Spine Complete  Result Date: 10/06/2022 CLINICAL DATA:   Left hip pain radiating down leg EXAM: LUMBAR SPINE - COMPLETE 4+ VIEW COMPARISON:  None Available. FINDINGS: Five lumbar-type vertebral bodies. Normal lumbar lordosis. No evidence of fracture or dislocation. Vertebral body heights are maintained. Mild degenerative changes at T11-12 and L4-5. IMPRESSION: Negative. Electronically Signed   By: Charline Bills M.D.   On: 10/06/2022 08:03   DG Hip Unilat W or Wo Pelvis 2-3 Views Left  Result Date: 10/06/2022 CLINICAL DATA:  Left hip pain EXAM: DG HIP (WITH OR WITHOUT PELVIS) 2-3V LEFT COMPARISON:  None Available. FINDINGS: No fracture or dislocation is seen. Bilateral hip joint spaces are preserved. Visualized bony pelvis is intact. IMPRESSION: Negative. Electronically Signed   By: Charline Bills M.D.   On: 10/06/2022 08:02    Procedures Procedures    Medications Ordered in ED Medications  HYDROmorphone (DILAUDID) injection 1 mg (1 mg Intramuscular Given 10/06/22 0756)    ED Course/ Medical Decision Making/ A&P                             Medical Decision Making Amount and/or Complexity of Data Reviewed Radiology: ordered.  Risk Prescription drug management.  This patient presents to the ED for concern of back pain and leg pain, this involves an extensive number of treatment options, and is a complaint that carries with it a high risk of complications and morbidity.  The differential diagnosis includes sciatica, renal infection   Co morbidities that complicate the patient evaluation  Hypertension   Additional history obtained:  Additional history obtained from patient External records from outside source obtained and reviewed including hospital records   Lab Tests:  No labs   Imaging Studies ordered:  I ordered imaging studies including x-rays left hip and lumbar spine I independently visualized and interpreted imaging which showed degenerative changes I agree with the radiologist interpretation   Cardiac  Monitoring: / EKG:  The patient was maintained on a cardiac monitor.  I personally viewed and interpreted the cardiac monitored which showed an underlying rhythm of: Normal sinus rhythm   Consultations Obtained:  No consultant  Problem List / ED Course / Critical interventions / Medication management  Hypertension and left hip pain I ordered medication including Dilaudid for pain Reevaluation of the patient after these medicines showed that the patient improved I have reviewed the patients home medicines and have made adjustments as needed   Social Determinants of Health:  None   Test / Admission - Considered:  None  Patient with sciatica pain down his left leg.  He will continue the  prednisone and is given Percocets for pain has not relieved by the nonsteroidal.  He will follow-up with his PCP        Final Clinical Impression(s) / ED Diagnoses Final diagnoses:  Sciatica of left side    Rx / DC Orders ED Discharge Orders     None         Bethann Berkshire, MD 10/06/22 1707

## 2022-10-13 ENCOUNTER — Ambulatory Visit (INDEPENDENT_AMBULATORY_CARE_PROVIDER_SITE_OTHER): Payer: PRIVATE HEALTH INSURANCE | Admitting: Orthopaedic Surgery

## 2022-10-13 ENCOUNTER — Encounter: Payer: Self-pay | Admitting: Orthopaedic Surgery

## 2022-10-13 VITALS — Ht 70.0 in | Wt 260.0 lb

## 2022-10-13 DIAGNOSIS — M545 Low back pain, unspecified: Secondary | ICD-10-CM | POA: Diagnosis not present

## 2022-10-13 DIAGNOSIS — M5416 Radiculopathy, lumbar region: Secondary | ICD-10-CM | POA: Diagnosis not present

## 2022-10-13 MED ORDER — DIAZEPAM 5 MG PO TABS: ORAL_TABLET | ORAL | 0 refills | Status: AC

## 2022-10-13 NOTE — Progress Notes (Addendum)
Office Visit Note   Patient: Nicholas Richards           Date of Birth: 1961-02-01           MRN: [id number] Visit Date: 10/13/2022              Requested by: Kirstie Peri, MD [address] [place],  [place] [address] PCP: Kirstie Peri, MD   Assessment & Plan: Visit Diagnoses:  1. Acute left-sided low back pain, unspecified whether sciatica present   2. Lumbar back pain with radiculopathy affecting left lower extremity     Plan: Will proceed with MRI scan for acute back pain left quad weakness.  Valium sent for his claustrophobia.  Office follow-up after scan for review. Work slip given no work pending follow-up after MRI scan for review. Follow-Up Instructions: No follow-ups on file.   Orders:  Orders Placed This Encounter  Procedures   MR Lumbar Spine w/o contrast   Meds ordered this encounter  Medications   diazepam (VALIUM) 5 MG tablet    Sig: Take one tablet one hour prior to procedure. May repeat as needed. MUST HAVE DRIVER.    Dispense:  3 tablet    Refill:  0      Procedures: No procedures performed   Clinical Data: No additional findings.   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  Said some numbness anteriorly on the thigh he was given a shot of prednisone prednisone taper did not get any relief.  Oxycodone slight relief but he is concerned about taking narcotics.  He has used ibuprofen and muscle relaxant.  He has borrowed a walker from someone but the walker is too short but he is worried that he will fall if he does not have a walker since the left leg is buckling.  He has not been able to work and he states he knows his leg will not let him get up on the forklift at this time.  No bowel bladder symptoms.  Pain radiates down just to the knee does not  go into the calf or ankle.  No past history of back problems in the past that he can recall.  Patient does have some diabetes states last A1c was he believes 6.9.  He has seasonal allergies also elevated cholesterol. Lumbar images recently obtained and available for review showed some endplate irregularity and narrowing at L4-5 and also L2-3.  Hip radiographs were normal.  Patient referred here by Dr.Shah for symptoms.  Former smoker he does have a little bit of asthma normally does not use an inhaler unless needed.  Review of Systems all other systems noncontributory to HPI.   Objective: Vital Signs: Ht 5\' 10"  (1.778 m)   Wt 260 lb (117.9 kg)   BMI 37.31 kg/m   Physical Exam Constitutional:      Appearance: He is well-developed.  HENT:     Head: Normocephalic and atraumatic.     Right Ear: External ear normal.     Left Ear: External ear normal.  Eyes:     Pupils: Pupils are equal, round, and reactive to light.  Neck:     Thyroid: No thyromegaly.     Trachea: No tracheal deviation.  Cardiovascular:  Rate and Rhythm: Normal rate.  Pulmonary:     Effort: Pulmonary effort is normal.     Breath sounds: No wheezing.  Abdominal:     General: Bowel sounds are normal.     Palpations: Abdomen is soft.  Musculoskeletal:     Cervical back: Neck supple.  Skin:    General: Skin is warm and dry.     Capillary Refill: Capillary refill takes less than 2 seconds.  Neurological:     Mental Status: He is alert and oriented to person, place, and time.  Psychiatric:        Behavior: Behavior normal.        Thought Content: Thought content normal.        Judgment: Judgment normal.     Ortho Exam patient has 2+ right knee jerk and right left ankle jerk.  Absent left knee jerk.  Left quad weakness I can overcome it with 3 fingers opposite right is strong.  No abductor weakness.  Some decrease sensation over the anterior thigh.  Normal sensation L5-S1 right and left.  Anterior tib  gastrocsoleus is normal negative straight leg raising 90 degrees.  Difficulty with the straight leg raising supine position.  Negative logroll hips.  Specialty Comments:  No specialty comments available.  Imaging: Narrative & Impression  CLINICAL DATA:  Left hip pain radiating down leg   EXAM: LUMBAR SPINE - COMPLETE 4+ VIEW   COMPARISON:  None Available.   FINDINGS: Five lumbar-type vertebral bodies.   Normal lumbar lordosis.   No evidence of fracture or dislocation. Vertebral body heights are maintained.   Mild degenerative changes at T11-12 and L4-5.   IMPRESSION: Negative.     Electronically Signed   By: Charline Bills M.D.   On: 10/06/2022 08:03     PMFS History: Patient Active Problem List   Diagnosis Date Noted   Lumbar back pain with radiculopathy affecting left lower extremity 10/13/2022   Past Medical History:  Diagnosis Date   Diabetes mellitus without complication (HCC)     No family history on file.  Past Surgical History:  Procedure Laterality Date   COLONOSCOPY N/A 09/16/2020   Procedure: COLONOSCOPY;  Surgeon: Corbin Ade, MD;  Location: [facility];  Service: Endoscopy;  Laterality: N/A;  ASA III / PM procedure   Social History   Occupational History   Not on file  Tobacco Use   Smoking status: Former   Smokeless tobacco: Never  Substance and Sexual Activity   Alcohol use: No   Drug use: No   Sexual activity: Yes

## 2022-10-25 ENCOUNTER — Encounter (HOSPITAL_COMMUNITY): Payer: Self-pay

## 2022-10-25 ENCOUNTER — Ambulatory Visit (HOSPITAL_COMMUNITY): Payer: PRIVATE HEALTH INSURANCE

## 2022-10-27 ENCOUNTER — Telehealth: Payer: Self-pay | Admitting: Radiology

## 2022-10-27 NOTE — Telephone Encounter (Signed)
I called patient and advised as his insurance is OON with [employer], that we would have him call insurance and see if they will allow [place] to see/treat him.  If he can get that approved, to call us back.  If not he will need to seek care from an in network provider.  Appt for tomorrow cancelled.

## 2022-10-28 ENCOUNTER — Ambulatory Visit: Payer: PRIVATE HEALTH INSURANCE | Admitting: Orthopedic Surgery

## 2022-10-28 DIAGNOSIS — M5416 Radiculopathy, lumbar region: Secondary | ICD-10-CM

## 2022-11-02 ENCOUNTER — Telehealth: Payer: Self-pay | Admitting: Radiology

## 2022-11-02 NOTE — Telephone Encounter (Signed)
Patient's wife called requesting MRI results. Per Dr. Ophelia Charter, ok to read results. We can send patient for ESI if he would like to have this. It would have to be somewhere that is contracted with his insurance.   I spoke with Tammy and advised of the above. I apologized for the difficult time that they have had due to insurance not being accepted to facilities he was referred to for MRI, as well as with our practice. Tammy states that she has called the insurance company and they told her they had no participating partners in [place]. Insurance is accepted by [employer] in [place], so Tammy is trying to reach them to schedule appointment. I advised we will be happy to get the notes together to send if she is able to schedule patient. She is aware they may have to pick up images on CD of the MRI done at [facility] as well as x-rays made at the hospital. She expressed understanding and will call me if there is anything that I can do to help get his care facilitated.
